# Patient Record
Sex: Male | Born: 1950 | Race: White | Hispanic: No | Marital: Single
Health system: Southern US, Community
[De-identification: ages and names within clinical notes are randomized; demographics above are authoritative.]

---

## 2017-04-10 ENCOUNTER — Emergency Department: Payer: No Typology Code available for payment source

## 2017-04-10 ENCOUNTER — Emergency Department
Admission: EM | Admit: 2017-04-10 | Discharge: 2017-04-10 | Disposition: A | Discharge status: Home / Self Care | Payer: No Typology Code available for payment source | Attending: Emergency Medicine | Admitting: Emergency Medicine

## 2017-04-10 DIAGNOSIS — S5002XA Contusion of left elbow, initial encounter: Secondary | ICD-10-CM | POA: Insufficient documentation

## 2017-04-10 DIAGNOSIS — M542 Cervicalgia: Secondary | ICD-10-CM | POA: Insufficient documentation

## 2017-04-10 DIAGNOSIS — Y999 Unspecified external cause status: Secondary | ICD-10-CM | POA: Insufficient documentation

## 2017-04-10 DIAGNOSIS — S80812A Abrasion, left lower leg, initial encounter: Secondary | ICD-10-CM | POA: Diagnosis not present

## 2017-04-10 DIAGNOSIS — Y92410 Unspecified street and highway as the place of occurrence of the external cause: Secondary | ICD-10-CM | POA: Diagnosis not present

## 2017-04-10 DIAGNOSIS — S59902A Unspecified injury of left elbow, initial encounter: Secondary | ICD-10-CM | POA: Diagnosis present

## 2017-04-10 DIAGNOSIS — Y9389 Activity, other specified: Secondary | ICD-10-CM | POA: Insufficient documentation

## 2017-04-10 DIAGNOSIS — M7918 Myalgia, other site: Secondary | ICD-10-CM

## 2017-04-10 MED ORDER — IBUPROFEN 800 MG PO TABS
800.0000 mg | ORAL_TABLET | Freq: Once | ORAL | Status: AC
  Administered 2017-04-10: 800 mg via ORAL
  Filled 2017-04-10: qty 1

## 2017-04-10 MED ORDER — CYCLOBENZAPRINE HCL 10 MG PO TABS
10.0000 mg | ORAL_TABLET | Freq: Once | ORAL | Status: AC
  Administered 2017-04-10: 10 mg via ORAL
  Filled 2017-04-10: qty 1

## 2017-04-10 MED ORDER — CYCLOBENZAPRINE HCL 10 MG PO TABS: 10.0000 mg | ORAL_TABLET | Freq: Three times a day (TID) | ORAL | 0 refills | Status: AC | PRN

## 2017-04-10 MED ORDER — IBUPROFEN 600 MG PO TABS: 600.0000 mg | ORAL_TABLET | Freq: Three times a day (TID) | ORAL | 0 refills | Status: AC | PRN
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
